# Patient Record
Sex: Male | Born: 1945 | Race: White | Marital: Married | Smoking: Former smoker
Health system: Southern US, Community
[De-identification: ages and names within clinical notes are randomized; demographics above are authoritative.]

---

## 2015-11-16 ENCOUNTER — Other Ambulatory Visit: Payer: Self-pay | Admitting: Orthopaedic Surgery

## 2015-11-16 DIAGNOSIS — M545 Low back pain: Secondary | ICD-10-CM

## 2015-11-21 ENCOUNTER — Ambulatory Visit
Admission: RE | Admit: 2015-11-21 | Discharge: 2015-11-21 | Disposition: A | Discharge status: Home / Self Care | Payer: Medicare Other | Source: Ambulatory Visit | Attending: Orthopaedic Surgery | Admitting: Orthopaedic Surgery

## 2015-11-21 ENCOUNTER — Other Ambulatory Visit: Payer: Self-pay

## 2015-11-21 DIAGNOSIS — M545 Low back pain: Secondary | ICD-10-CM

## 2016-03-11 ENCOUNTER — Telehealth (INDEPENDENT_AMBULATORY_CARE_PROVIDER_SITE_OTHER): Payer: Self-pay | Admitting: Physical Medicine and Rehabilitation

## 2016-03-11 NOTE — Telephone Encounter (Signed)
Just do L3 tf

## 2016-03-12 NOTE — Telephone Encounter (Signed)
No precert required for [employer] 

## 2016-03-12 NOTE — Telephone Encounter (Signed)
Left message for Ivan MalesLinda Landry to call to schedule the patient.

## 2016-03-20 ENCOUNTER — Ambulatory Visit (INDEPENDENT_AMBULATORY_CARE_PROVIDER_SITE_OTHER): Payer: Medicare Other | Admitting: Physical Medicine and Rehabilitation

## 2016-03-20 ENCOUNTER — Encounter (INDEPENDENT_AMBULATORY_CARE_PROVIDER_SITE_OTHER): Payer: Self-pay | Admitting: Physical Medicine and Rehabilitation

## 2016-03-20 VITALS — BP 119/69 | HR 72 | Temp 98.1°F

## 2016-03-20 DIAGNOSIS — M5416 Radiculopathy, lumbar region: Secondary | ICD-10-CM

## 2016-03-20 MED ORDER — LIDOCAINE HCL (PF) 1 % IJ SOLN
0.3300 mL | Freq: Once | INTRAMUSCULAR | Status: AC
  Administered 2016-03-20: 0.3 mL

## 2016-03-20 MED ORDER — METHYLPREDNISOLONE ACETATE 80 MG/ML IJ SUSP
80.0000 mg | Freq: Once | INTRAMUSCULAR | Status: AC
  Administered 2016-03-20: 80 mg

## 2016-03-20 NOTE — Procedures (Signed)
Lumbosacral Transforaminal Epidural Steroid Injection - Infraneural Approach with Fluoroscopic Guidance  Patient: Ivan Landry      Date of Birth: 1945-07-07 MRN: [id number] PCP: No primary care provider on file.      Visit Date: 03/20/2016   Universal Protocol:    Date/Time: 11/22/179:15 AM  Consent Given By: the patient  Position: PRONE   Additional Comments: Vital signs were monitored before and after the procedure. Patient was prepped and draped in the usual sterile fashion. The correct patient, procedure, and site was verified.   Injection Procedure Details:  Procedure Site One Meds Administered:  Meds ordered this encounter  Medications  . lidocaine (PF) (XYLOCAINE) 1 % injection 0.3 mL  . methylPREDNISolone acetate (DEPO-MEDROL) injection 80 mg      Laterality: Left  Location/Site:  L3-L4  Needle size: 22 G  Needle type: Spinal  Needle Placement: Transforaminal  Findings:  -Contrast Used: 1 mL iohexol 180 mg iodine/mL   -Comments: Excellent flow of contrast along the nerve and into the epidural space.  Procedure Details: After squaring off the end-plates of the desired vertebral level to get a true AP view, the C-arm was obliqued to the painful side so that the superior articulating process is positioned about 1/3 the length of the inferior endplate.  The needle was aimed toward the junction of the superior articular process and the transverse process of the inferior vertebrae. The needle's initial entry is in the lower third of the foramen through Kambin's triangle. The soft tissues overlying this target were infiltrated with 2-3 ml. of 1% Lidocaine without Epinephrine.  The spinal needle was then inserted and advanced toward the target using a "trajectory" view along the fluoroscope beam.  Under AP and lateral visualization, the needle was advanced so it did not puncture dura and did not traverse medially beyond the 6 o'clock position of the pedicle. Bi-planar  projections were used to confirm position. Aspiration was confirmed to be negative for CSF and/or blood. A 1-2 ml. volume of Isovue-250 was injected and flow of contrast was noted at each level. Radiographs were obtained for documentation purposes.   After attaining the desired flow of contrast documented above, a 0.5 to 1.0 ml test dose of 0.25% Marcaine was injected into each respective transforaminal space.  The patient was observed for 90 seconds post injection.  After no sensory deficits were reported, and normal lower extremity motor function was noted,   the above injectate was administered so that equal amounts of the injectate were placed at each foramen (level) into the transforaminal epidural space.   Additional Comments:  The patient tolerated the procedure well Dressing: Band-Aid    Post-procedure details: Patient was observed during the procedure. Post-procedure instructions were reviewed.  Patient left the clinic in stable condition.

## 2016-03-20 NOTE — Progress Notes (Signed)
Ivan Landry - 70 y.o. male MRN [id number]  Date of birth: [birth year]  Office Visit Note: Visit Date: 03/20/2016 PCP: No primary care provider on file. Referred by: No ref. provider found  Subjective: Chief Complaint  Patient presents with  . Lower Back - Pain   HPI: Ivan Landry is a pleasant 70 year old gentleman who came to us originally with left hip and leg pain consistent with stenosis. Most of his pain is with standing and ambulating. He says sitting has no pain whatsoever. If he leans forward while he is hurting it does relieve the symptoms. He gets pain in the left hip and leg. Prior injection at L3 and L4 help with the last injection only helped about a week. The one before that was better. He has not had prior back surgery. He has 1 level stenosis.    Lower back pain. Last injection worked about a week. Left leg pain, tingling left leg. Driver present, no blood thinners, no dye allergy. Pain is with standing or walking. Better with sitting or bending over. ROS Otherwise per HPI.  Assessment & Plan: Visit Diagnoses:  1. Lumbar radiculopathy     Plan: Findings:  I'll point to repeat an L3 transforaminal injection try to get good flow of contrast in the area of stenosis. If this seems to help him once again and does last for a good length of time and it may be something we can repeat off and on. The last injection helped greatly but just didn't last very long. I also want him to see Dr. Otelia SergeantNitka in our office for consultation for potential decompression laminectomy.    Meds & Orders:  Meds ordered this encounter  Medications  . lidocaine (PF) (XYLOCAINE) 1 % injection 0.3 mL  . methylPREDNISolone acetate (DEPO-MEDROL) injection 80 mg    Orders Placed This Encounter  Procedures  . Epidural Steroid injection    Follow-up: Return for schedule consult with Dr. Otelia SergeantNitka.   Procedures: No procedures performed  Lumbosacral Transforaminal Epidural Steroid Injection - Infraneural  Approach with Fluoroscopic Guidance  Patient: Ivan Landry      Date of Birth: [birth year] MRN: [id number] PCP: No primary care provider on file.      Visit Date: 03/20/2016   Universal Protocol:    Date/Time: 11/22/179:15 AM  Consent Given By: the patient  Position: PRONE   Additional Comments: Vital signs were monitored before and after the procedure. Patient was prepped and draped in the usual sterile fashion. The correct patient, procedure, and site was verified.   Injection Procedure Details:  Procedure Site One Meds Administered:  Meds ordered this encounter  Medications  . lidocaine (PF) (XYLOCAINE) 1 % injection 0.3 mL  . methylPREDNISolone acetate (DEPO-MEDROL) injection 80 mg      Laterality: Left  Location/Site:  L3-L4  Needle size: 22 G  Needle type: Spinal  Needle Placement: Transforaminal  Findings:  -Contrast Used: 1 mL iohexol 180 mg iodine/mL   -Comments: Excellent flow of contrast along the nerve and into the epidural space.  Procedure Details: After squaring off the end-plates of the desired vertebral level to get a true AP view, the C-arm was obliqued to the painful side so that the superior articulating process is positioned about 1/3 the length of the inferior endplate.  The needle was aimed toward the junction of the superior articular process and the transverse process of the inferior vertebrae. The needle's initial entry is in the lower third of the foramen through Kambin's triangle. The  soft tissues overlying this target were infiltrated with 2-3 ml. of 1% Lidocaine without Epinephrine.  The spinal needle was then inserted and advanced toward the target using a "trajectory" view along the fluoroscope beam.  Under AP and lateral visualization, the needle was advanced so it did not puncture dura and did not traverse medially beyond the 6 o'clock position of the pedicle. Bi-planar projections were used to confirm position. Aspiration was confirmed  to be negative for CSF and/or blood. A 1-2 ml. volume of Isovue-250 was injected and flow of contrast was noted at each level. Radiographs were obtained for documentation purposes.   After attaining the desired flow of contrast documented above, a 0.5 to 1.0 ml test dose of 0.25% Marcaine was injected into each respective transforaminal space.  The patient was observed for 90 seconds post injection.  After no sensory deficits were reported, and normal lower extremity motor function was noted,   the above injectate was administered so that equal amounts of the injectate were placed at each foramen (level) into the transforaminal epidural space.   Additional Comments:  The patient tolerated the procedure well Dressing: Band-Aid    Post-procedure details: Patient was observed during the procedure. Post-procedure instructions were reviewed.  Patient left the clinic in stable condition.     Clinical History: No specialty comments available.  He reports that he quit smoking about 4 years ago. He has never used smokeless tobacco. No results for input(s): HGBA1C, LABURIC in the last 8760 hours.  Objective:  VS:  HT:    WT:   BMI:     BP:119/69  HR:72bpm  TEMP:98.1 F (36.7 C)( )  RESP:97 % Physical Exam  Musculoskeletal:  Patient ambulates with a forward flexed spine. He has good distal strength without deficit.    Ortho Exam Imaging: No results found.  Past Medical/Family/Surgical/Social History: Medications & Allergies reviewed per EMR There are no active problems to display for this patient.  History reviewed. No pertinent past medical history. History reviewed. No pertinent family history. History reviewed. No pertinent surgical history. Social History   Occupational History  . Not on file.   Social History Main Topics  . Smoking status: Former Smoker    Quit date: 2013  . Smokeless tobacco: Never Used  . Alcohol use Not on file  . Drug use: Unknown  . Sexual  activity: Not on file

## 2016-03-20 NOTE — Patient Instructions (Signed)

## 2016-05-03 ENCOUNTER — Ambulatory Visit (INDEPENDENT_AMBULATORY_CARE_PROVIDER_SITE_OTHER): Payer: Medicare HMO | Admitting: Specialist

## 2016-05-03 ENCOUNTER — Encounter (INDEPENDENT_AMBULATORY_CARE_PROVIDER_SITE_OTHER): Payer: Self-pay

## 2016-05-03 DIAGNOSIS — Z5321 Procedure and treatment not carried out due to patient leaving prior to being seen by health care provider: Secondary | ICD-10-CM

## 2016-05-06 ENCOUNTER — Telehealth (INDEPENDENT_AMBULATORY_CARE_PROVIDER_SITE_OTHER): Payer: Self-pay

## 2016-05-06 NOTE — Telephone Encounter (Signed)
Ok if it helped but will need f/up [place] after

## 2016-05-06 NOTE — Telephone Encounter (Signed)
Received voice mail on Friday and pt is requesting another inj. Was supposed to see Dr. Otelia SergeantNitka on Friday but left without being seen. Had left L3 & L4 TF on 01/08/16. Ok to repeat?

## 2016-05-08 NOTE — Progress Notes (Signed)
Patient left without being seen.

## 2016-05-14 ENCOUNTER — Ambulatory Visit (INDEPENDENT_AMBULATORY_CARE_PROVIDER_SITE_OTHER): Payer: Medicare HMO | Admitting: Physical Medicine and Rehabilitation

## 2016-05-14 ENCOUNTER — Encounter (INDEPENDENT_AMBULATORY_CARE_PROVIDER_SITE_OTHER): Payer: Self-pay | Admitting: Physical Medicine and Rehabilitation

## 2016-05-14 VITALS — BP 149/110 | HR 91 | Temp 98.2°F

## 2016-05-14 DIAGNOSIS — M5416 Radiculopathy, lumbar region: Secondary | ICD-10-CM | POA: Diagnosis not present

## 2016-05-14 DIAGNOSIS — M48062 Spinal stenosis, lumbar region with neurogenic claudication: Secondary | ICD-10-CM

## 2016-05-14 MED ORDER — LIDOCAINE HCL (PF) 1 % IJ SOLN
0.3300 mL | Freq: Once | INTRAMUSCULAR | Status: AC
  Administered 2016-05-14: 0.3 mL

## 2016-05-14 MED ORDER — METHYLPREDNISOLONE ACETATE 80 MG/ML IJ SUSP
80.0000 mg | Freq: Once | INTRAMUSCULAR | Status: AC
  Administered 2016-05-14: 80 mg

## 2016-05-14 NOTE — Patient Instructions (Signed)

## 2016-05-14 NOTE — Procedures (Signed)
Lumbosacral Transforaminal Epidural Steroid Injection - Infraneural Approach with Fluoroscopic Guidance  Patient: Ivan Landry      Date of Birth: 1945/12/03 MRN: [id number] PCP: Doreatha MartinVELAZQUEZ,GRETCHEN, MD      Visit Date: 05/14/2016   Universal Protocol:    Date/Time: 01/16/182:14 PM  Consent Given By: the patient  Position: PRONE   Additional Comments: Vital signs were monitored before and after the procedure. Patient was prepped and draped in the usual sterile fashion. The correct patient, procedure, and site was verified.   Injection Procedure Details:  Procedure Site One Meds Administered:  Meds ordered this encounter  Medications  . lidocaine (PF) (XYLOCAINE) 1 % injection 0.3 mL  . methylPREDNISolone acetate (DEPO-MEDROL) injection 80 mg      Laterality: Left  Location/Site:  L3-L4 L4-L5  Needle size: 22 G  Needle type: Spinal  Needle Placement: Transforaminal  Findings:  -Contrast Used: 2 mL iohexol 180 mg iodine/mL   -Comments: Excellent flow of contrast along the nerve and into the epidural space.  Procedure Details: After squaring off the end-plates of the desired vertebral level to get a true AP view, the C-arm was obliqued to the painful side so that the superior articulating process is positioned about 1/3 the length of the inferior endplate.  The needle was aimed toward the junction of the superior articular process and the transverse process of the inferior vertebrae. The needle's initial entry is in the lower third of the foramen through Kambin's triangle. The soft tissues overlying this target were infiltrated with 2-3 ml. of 1% Lidocaine without Epinephrine.  The spinal needle was then inserted and advanced toward the target using a "trajectory" view along the fluoroscope beam.  Under AP and lateral visualization, the needle was advanced so it did not puncture dura and did not traverse medially beyond the 6 o'clock position of the pedicle. Bi-planar  projections were used to confirm position. Aspiration was confirmed to be negative for CSF and/or blood. A 1-2 ml. volume of Isovue-250 was injected and flow of contrast was noted at each level. Radiographs were obtained for documentation purposes.   After attaining the desired flow of contrast documented above, a 0.5 to 1.0 ml test dose of 0.25% Marcaine was injected into each respective transforaminal space.  The patient was observed for 90 seconds post injection.  After no sensory deficits were reported, and normal lower extremity motor function was noted,   the above injectate was administered so that equal amounts of the injectate were placed at each foramen (level) into the transforaminal epidural space.   Additional Comments:  The patient tolerated the procedure well No complications occurred Dressing: Band-Aid and 2x2 sterile gauze     Post-procedure details: Patient was observed during the procedure. Post-procedure instructions were reviewed.  Patient left the clinic in stable condition.

## 2016-05-14 NOTE — Progress Notes (Signed)
Ivan CollinDennis Landry - 71 y.o. male MRN [id number]  Date of birth: [birth year]  Office Visit Note: Visit Date: 05/14/2016 PCP: Doreatha MartinVELAZQUEZ,GRETCHEN, MD Referred by: Cheron SchaumannVelazquez, Gretchen Y.,*  Subjective: Chief Complaint  Patient presents with  . Lower Back - Pain   HPI: Ivan Landry is a pleasant and active 71 year old gentleman with known severe stenosis at L3-4 and L4-5. Patient states he had great relief with last injection for around 3 weeks and then gradual increase in pain. Pain all the way across lower back and down left leg with walking. Starts with tingling and then enough pain if he keeps walking he has to sit down for a few minutes to get relief. Also Gets relief with bending over. He was scheduled to see Dr. Otelia SergeantNitka for evaluation for potential decompression surgery. He states that the person that brought him to the appointment at another appointment to go to Dr. Otelia SergeantNitka was running late so he did have to postpone that evaluation but he does want to see him. The patient has failed conservative care with medications and time in therapy and is had injections in the very beneficial just short lived and not long-lasting.    ROS Otherwise per HPI.  Assessment & Plan: Visit Diagnoses:  1. Lumbar radiculopathy   2. Spinal stenosis of lumbar region with neurogenic claudication     Plan: Findings:  Repeat left L3 and L4 transforaminal epidural steroid injection for left radicular leg pain with stenosis at both levels which is severe. Patient did well with last injection.    Meds & Orders:  Meds ordered this encounter  Medications  . lidocaine (PF) (XYLOCAINE) 1 % injection 0.3 mL  . methylPREDNISolone acetate (DEPO-MEDROL) injection 80 mg    Orders Placed This Encounter  Procedures  . Epidural Steroid injection    Follow-up: Return if symptoms worsen or fail to improve, 2 weeks.   Procedures: No procedures performed  Lumbosacral Transforaminal Epidural Steroid Injection - Infraneural  Approach with Fluoroscopic Guidance  Patient: Ivan Landry      Date of Birth: [birth year] MRN: [id number] PCP: Doreatha MartinVELAZQUEZ,GRETCHEN, MD      Visit Date: 05/14/2016   Universal Protocol:    Date/Time: 01/16/182:14 PM  Consent Given By: the patient  Position: PRONE   Additional Comments: Vital signs were monitored before and after the procedure. Patient was prepped and draped in the usual sterile fashion. The correct patient, procedure, and site was verified.   Injection Procedure Details:  Procedure Site One Meds Administered:  Meds ordered this encounter  Medications  . lidocaine (PF) (XYLOCAINE) 1 % injection 0.3 mL  . methylPREDNISolone acetate (DEPO-MEDROL) injection 80 mg      Laterality: Left  Location/Site:  L3-L4 L4-L5  Needle size: 22 G  Needle type: Spinal  Needle Placement: Transforaminal  Findings:  -Contrast Used: 2 mL iohexol 180 mg iodine/mL   -Comments: Excellent flow of contrast along the nerve and into the epidural space.  Procedure Details: After squaring off the end-plates of the desired vertebral level to get a true AP view, the C-arm was obliqued to the painful side so that the superior articulating process is positioned about 1/3 the length of the inferior endplate.  The needle was aimed toward the junction of the superior articular process and the transverse process of the inferior vertebrae. The needle's initial entry is in the lower third of the foramen through Kambin's triangle. The soft tissues overlying this target were infiltrated with 2-3 ml. of 1% Lidocaine without Epinephrine.  The spinal needle was then inserted and advanced toward the target using a "trajectory" view along the fluoroscope beam.  Under AP and lateral visualization, the needle was advanced so it did not puncture dura and did not traverse medially beyond the 6 o'clock position of the pedicle. Bi-planar projections were used to confirm position. Aspiration was confirmed to  be negative for CSF and/or blood. A 1-2 ml. volume of Isovue-250 was injected and flow of contrast was noted at each level. Radiographs were obtained for documentation purposes.   After attaining the desired flow of contrast documented above, a 0.5 to 1.0 ml test dose of 0.25% Marcaine was injected into each respective transforaminal space.  The patient was observed for 90 seconds post injection.  After no sensory deficits were reported, and normal lower extremity motor function was noted,   the above injectate was administered so that equal amounts of the injectate were placed at each foramen (level) into the transforaminal epidural space.   Additional Comments:  The patient tolerated the procedure well No complications occurred Dressing: Band-Aid and 2x2 sterile gauze     Post-procedure details: Patient was observed during the procedure. Post-procedure instructions were reviewed.  Patient left the clinic in stable condition.     Clinical History: No specialty comments available.  He reports that he quit smoking about 5 years ago. He has never used smokeless tobacco. No results for input(s): HGBA1C, LABURIC in the last 8760 hours.  Objective:  VS:  HT:    WT:   BMI:     BP:(!) 149/110  HR:91bpm  TEMP:98.2 F (36.8 C)( )  RESP:95 % Physical Exam  Musculoskeletal:  The patient ambulates without aid with a forward flexed spine with good distal strength.    Ortho Exam Imaging: No results found.  Past Medical/Family/Surgical/Social History: Medications & Allergies reviewed per EMR There are no active problems to display for this patient.  No past medical history on file. No family history on file. No past surgical history on file. Social History   Occupational History  . Not on file.   Social History Main Topics  . Smoking status: Former Smoker    Quit date: 2013  . Smokeless tobacco: Never Used  . Alcohol use Not on file  . Drug use: Unknown  . Sexual activity: Not  on file

## 2016-06-26 ENCOUNTER — Ambulatory Visit (INDEPENDENT_AMBULATORY_CARE_PROVIDER_SITE_OTHER): Payer: Medicare HMO | Admitting: Specialist

## 2016-06-26 ENCOUNTER — Ambulatory Visit (INDEPENDENT_AMBULATORY_CARE_PROVIDER_SITE_OTHER): Payer: Medicare HMO

## 2016-06-26 ENCOUNTER — Encounter (INDEPENDENT_AMBULATORY_CARE_PROVIDER_SITE_OTHER): Payer: Self-pay | Admitting: Specialist

## 2016-06-26 VITALS — BP 117/71 | HR 67 | Ht 68.5 in | Wt 177.0 lb

## 2016-06-26 DIAGNOSIS — M48062 Spinal stenosis, lumbar region with neurogenic claudication: Secondary | ICD-10-CM | POA: Diagnosis not present

## 2016-06-26 DIAGNOSIS — M4316 Spondylolisthesis, lumbar region: Secondary | ICD-10-CM

## 2016-06-26 NOTE — Patient Instructions (Signed)
Avoid bending, stooping and avoid lifting weights greater than 10 lbs. Avoid prolong standing and walking. Avoid frequent bending and stooping  No lifting greater than 10 lbs. May use ice or moist heat for pain. Weight loss is of benefit. Handicap license is approved. Dr. Newton's secretary/Assistant will call to arrange for epidural steroid injection  

## 2016-06-26 NOTE — Progress Notes (Signed)
Office Visit Note   Patient: Ivan Landry           Date of Birth: Sep 17, 1945           MRN: [id number] Visit Date: 06/26/2016              Requested by: Cheron Schaumann, MD [address] [place], [place] [address] PCP: Doreatha Martin, MD   Assessment & Plan: Visit Diagnoses:  1. Spinal stenosis of lumbar region with neurogenic claudication   2. Spondylolisthesis, lumbar region     Plan: Avoid bending, stooping and avoid lifting weights greater than 10 lbs. Avoid prolong standing and walking. Avoid frequent bending and stooping  No lifting greater than 10 lbs. May use ice or moist heat for pain. Weight loss is of benefit. Handicap license is approved. Dr. Lamont Blas secretary/Assistant will call to arrange for epidural steroid injection    Follow-Up Instructions: No Follow-up on file.   Orders:  Orders Placed This Encounter  Procedures  . XR Lumbar Spine 2-3 Views  . Ambulatory referral to Physical Medicine Rehab   No orders of the defined types were placed in this encounter.     Procedures: No procedures performed   Clinical Data: No additional findings.   Subjective: Chief Complaint  Patient presents with  . Lower Back - Pain    Patient returns for follow up low back pain. He is status post Left L3-4, L4-5 transforaminal injection with Dr. Alvester Morin on 05/14/2016. He states that this injection has helped better than any previous one. The pain that he had on the left side is completely gone. He does have some pain now on the right side of his back that radiates to the lateral side of the right hip. He states that this is new. Pain the last 2 nights and over the last 2 weeks with pain in the right side upper buttock and right lateral hip and lateral proximal thigh. Cough or sneeze with pain in both buttocks. Bowel and bladder function is normal. Walking distance is limited sometimes couldn't walk far without leaning on a cart or finding a place to sit.  With the injection the pain with standing and walking improved.    Review of Systems  Constitutional: Negative.   HENT: Negative.   Eyes: Negative.   Respiratory: Negative.   Cardiovascular: Negative.   Gastrointestinal: Negative.   Endocrine: Negative.   Genitourinary: Negative.   Musculoskeletal: Negative.   Skin: Negative.   Allergic/Immunologic: Negative.   Neurological: Negative.   Hematological: Negative.   Psychiatric/Behavioral: Negative.      Objective: Vital Signs: BP 117/71   Pulse 67   Ht 5' 8.5" (1.74 m)   Wt 177 lb (80.3 kg)   BMI 26.52 kg/m   Physical Exam  Constitutional: He is oriented to person, place, and time. He appears well-developed and well-nourished.  HENT:  Head: Normocephalic and atraumatic.  Eyes: EOM are normal. Pupils are equal, round, and reactive to light.  Neck: Normal range of motion. Neck supple.  Pulmonary/Chest: Effort normal and breath sounds normal.  Abdominal: Soft. Bowel sounds are normal.  Neurological: He is alert and oriented to person, place, and time.  Skin: Skin is warm and dry.  Psychiatric: He has a normal mood and affect. His behavior is normal. Judgment and thought content normal.    Back Exam   Tenderness  The patient is experiencing tenderness in the lumbar.  Range of Motion  Extension: abnormal  Flexion: abnormal  Lateral Bend  Right: abnormal  Lateral Bend Left: abnormal  Rotation Right: abnormal  Rotation Left: abnormal   Muscle Strength  Right Quadriceps:  5/5  Left Quadriceps:  5/5  Right Hamstrings:  5/5  Left Hamstrings:  5/5   Reflexes  Babinski's sign: normal   Other  Toe Walk: normal Heel Walk: normal Sensation: decreased Gait: abnormal   Comments:  Weaknes in both feet dorsiflexors      Specialty Comments:  No specialty comments available.  Imaging: Xr Lumbar Spine 2-3 Views  Result Date: 06/26/2016 AP and lateral flexion and extension radiographs show grade 1  spondylolisthesis L3-4 no change with flexion and extension, minimal anterolisthesis at L4-5 1-2 mm with reduction in extension. 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 No fracture, or dislocation    PMFS History: There are no active problems to display for this patient.  No past medical history on file.  No family history on file.  No past surgical history on file. Social History   Occupational History  . Not on file.   Social History Main Topics  . Smoking status: Former Smoker    Quit date: 2013  . Smokeless tobacco: Never Used  . Alcohol use Not on file  . Drug use: Unknown  . Sexual activity: Not on file

## 2016-06-27 ENCOUNTER — Ambulatory Visit (INDEPENDENT_AMBULATORY_CARE_PROVIDER_SITE_OTHER): Payer: Medicare HMO | Admitting: Specialist

## 2016-07-09 ENCOUNTER — Ambulatory Visit (INDEPENDENT_AMBULATORY_CARE_PROVIDER_SITE_OTHER): Payer: Medicare HMO | Admitting: Physical Medicine and Rehabilitation

## 2016-07-09 ENCOUNTER — Ambulatory Visit (INDEPENDENT_AMBULATORY_CARE_PROVIDER_SITE_OTHER): Payer: Self-pay

## 2016-07-09 ENCOUNTER — Encounter (INDEPENDENT_AMBULATORY_CARE_PROVIDER_SITE_OTHER): Payer: Self-pay | Admitting: Physical Medicine and Rehabilitation

## 2016-07-09 VITALS — BP 137/82 | HR 66

## 2016-07-09 DIAGNOSIS — M5416 Radiculopathy, lumbar region: Secondary | ICD-10-CM | POA: Diagnosis not present

## 2016-07-09 MED ORDER — LIDOCAINE HCL (PF) 1 % IJ SOLN
0.3300 mL | Freq: Once | INTRAMUSCULAR | Status: AC
  Administered 2016-07-09: 0.3 mL

## 2016-07-09 MED ORDER — METHYLPREDNISOLONE ACETATE 80 MG/ML IJ SUSP
80.0000 mg | Freq: Once | INTRAMUSCULAR | Status: AC
  Administered 2016-07-09: 80 mg

## 2016-07-09 NOTE — Procedures (Signed)
Lumbosacral Transforaminal Epidural Steroid Injection - Infraneural Approach with Fluoroscopic Guidance  Patient: Ivan Landry      Date of Birth: 14-Mar-1946 MRN: [id number] PCP: Doreatha MartinVELAZQUEZ,GRETCHEN, MD      Visit Date: 07/09/2016   Universal Protocol:    Date/Time: 03/13/182:55 PM  Consent Given By: the patient  Position: PRONE   Additional Comments: Vital signs were monitored before and after the procedure. Patient was prepped and draped in the usual sterile fashion. The correct patient, procedure, and site was verified.   Injection Procedure Details:  Procedure Site One Meds Administered:  Meds ordered this encounter  Medications  . lidocaine (PF) (XYLOCAINE) 1 % injection 0.3 mL  . methylPREDNISolone acetate (DEPO-MEDROL) injection 80 mg      Laterality: Right  Location/Site:  L4-L5 L5-S1  Needle size: 22 G  Needle type: Spinal  Needle Placement: Transforaminal  Findings:  -Contrast Used: 1 mL iohexol 180 mg iodine/mL   -Comments: Excellent flow of contrast along the nerve and into the epidural space.  Procedure Details: After squaring off the end-plates of the desired vertebral level to get a true AP view, the C-arm was obliqued to the painful side so that the superior articulating process is positioned about 1/3 the length of the inferior endplate.  The needle was aimed toward the junction of the superior articular process and the transverse process of the inferior vertebrae. The needle's initial entry is in the lower third of the foramen through Kambin's triangle. The soft tissues overlying this target were infiltrated with 2-3 ml. of 1% Lidocaine without Epinephrine.  The spinal needle was then inserted and advanced toward the target using a "trajectory" view along the fluoroscope beam.  Under AP and lateral visualization, the needle was advanced so it did not puncture dura and did not traverse medially beyond the 6 o'clock position of the pedicle. Bi-planar  projections were used to confirm position. Aspiration was confirmed to be negative for CSF and/or blood. A 1-2 ml. volume of Isovue-250 was injected and flow of contrast was noted at each level. Radiographs were obtained for documentation purposes.   After attaining the desired flow of contrast documented above, a 0.5 to 1.0 ml test dose of 0.25% Marcaine was injected into each respective transforaminal space.  The patient was observed for 90 seconds post injection.  After no sensory deficits were reported, and normal lower extremity motor function was noted,   the above injectate was administered so that equal amounts of the injectate were placed at each foramen (level) into the transforaminal epidural space.   Additional Comments:  The patient tolerated the procedure well Dressing: Band-Aid    Post-procedure details: Patient was observed during the procedure. Post-procedure instructions were reviewed.  Patient left the clinic in stable condition.

## 2016-07-09 NOTE — Patient Instructions (Signed)

## 2016-07-09 NOTE — Progress Notes (Signed)
Ivan Landry - 71 y.o. male MRN [id number]  Date of birth: Mar 13, 1946  Office Visit Note: Visit Date: 07/09/2016 PCP: Doreatha MartinVELAZQUEZ,GRETCHEN, MD Referred by: Cheron SchaumannVelazquez, Gretchen Y.,*  Subjective: Chief Complaint  Patient presents with  . 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 The symptoms axial resolved fairly nicely after the injection. He states he still has a little bit symptoms on the left but not that bad at all. His right side is been given a more complaints lately particularly with standing and twisting and he will get a radiating pattern in the hip is very sharp. He has actually seen Dr. Otelia SergeantNitka for evaluation of Dr. Otelia SergeantNitka requested L4-L5 transforaminal injection on the right. The patient's case is complicated by history of anxiety Pain across lower back and now worse on right side for around 1 month. Left side still bothers him but not as bad since last injection. Sharp pains with certain movements and when he coughs. Denies numbness or tingling.    right L4 and L5 ROS Otherwise per HPI.  Assessment & Plan: Visit Diagnoses:  1. Lumbar radiculopathy     Plan: Findings:  Right L4-L5 transforaminal epidural steroid injection. As of note the L5 transforaminal injection elicited concordant symptoms with injection of medication.    Meds & Orders:  Meds ordered this encounter  Medications  . lidocaine (PF) (XYLOCAINE) 1 % injection 0.3 mL  . methylPREDNISolone acetate (DEPO-MEDROL) injection 80 mg    Orders Placed This Encounter  Procedures  . XR C-ARM NO REPORT  . Epidural Steroid injection    Follow-up: Return for scheduled follow up with Dr. Otelia SergeantNitka.   Procedures: No procedures performed  Lumbosacral Transforaminal Epidural Steroid Injection - Infraneural Approach with Fluoroscopic  Guidance  Patient: Ivan Landry      Date of Birth: Mar 13, 1946 MRN: [id number] PCP: Doreatha MartinVELAZQUEZ,GRETCHEN, MD      Visit Date: 07/09/2016   Universal Protocol:    Date/Time: 03/13/182:55 PM  Consent Given By: the patient  Position: PRONE   Additional Comments: Vital signs were monitored before and after the procedure. Patient was prepped and draped in the usual sterile fashion. The correct patient, procedure, and site was verified.   Injection Procedure Details:  Procedure Site One Meds Administered:  Meds ordered this encounter  Medications  . lidocaine (PF) (XYLOCAINE) 1 % injection 0.3 mL  . methylPREDNISolone acetate (DEPO-MEDROL) injection 80 mg      Laterality: Right  Location/Site:  L4-L5 L5-S1  Needle size: 22 G  Needle type: Spinal  Needle Placement: Transforaminal  Findings:  -Contrast Used: 1 mL iohexol 180 mg iodine/mL   -Comments: Excellent flow of contrast along the nerve and into the epidural space.  Procedure Details: After squaring off the end-plates of the desired vertebral level to get a true AP view, the C-arm was obliqued to the painful side so that the superior articulating process is positioned about 1/3 the length of the inferior endplate.  The needle was aimed toward the junction of the superior articular process and the transverse process of the inferior vertebrae. The needle's initial entry is in the lower third of the foramen through Kambin's triangle. The soft tissues overlying this target were infiltrated with 2-3 ml. of 1% Lidocaine without Epinephrine.  The spinal needle was then inserted and advanced toward the target  using a "trajectory" view along the fluoroscope beam.  Under AP and lateral visualization, the needle was advanced so it did not puncture dura and did not traverse medially beyond the 6 o'clock position of the pedicle. Bi-planar projections were used to confirm position. Aspiration was confirmed to be negative for CSF and/or  blood. A 1-2 ml. volume of Isovue-250 was injected and flow of contrast was noted at each level. Radiographs were obtained for documentation purposes.   After attaining the desired flow of contrast documented above, a 0.5 to 1.0 ml test dose of 0.25% Marcaine was injected into each respective transforaminal space.  The patient was observed for 90 seconds post injection.  After no sensory deficits were reported, and normal lower extremity motor function was noted,   the above injectate was administered so that equal amounts of the injectate were placed at each foramen (level) into the transforaminal epidural space.   Additional Comments:  The patient tolerated the procedure well Dressing: Band-Aid    Post-procedure details: Patient was observed during the procedure. Post-procedure instructions were reviewed.  Patient left the clinic in stable condition.   Clinical History: Lumbar spine MRI dated 11/21/2015  L4-5: Diffuse bulging degenerated annulus, short pedicles, advanced facet disease and significant ligamentum flavum thickening all contributing to severe spinal and bilateral lateral recess stenosis. Mild foraminal encroachment bilaterally also. L5-S1: Mild annular bulge and moderate facet disease but no focal disc protrusions, spinal or foraminal stenosis. Fat IMPRESSION: 1. 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 2. Broad-based left foraminal and extra foraminal disc protrusion at L3-4 contacting and displacing the left L3 nerve root. This may account for the patient's left thigh pain. 3. Broad-based bulging annulus at L1-2 with mild bilateral lateral recess and foraminal encroachment.  He reports that he quit smoking about 5 years ago. He has never used smokeless tobacco. No results for input(s): HGBA1C, LABURIC in the last 8760 hours.  Objective:  VS:  HT:    WT:   BMI:     BP:137/82  HR:66bpm  TEMP: ( )  RESP:99 % Physical  Exam  Musculoskeletal:  Patient ambulates with a forward flexed spine but without aid in good distal strength.    Ortho Exam Imaging: Xr C-arm No Report  Result Date: 07/09/2016 Please see Notes or Procedures tab for imaging impression.   Past Medical/Family/Surgical/Social History: Medications & Allergies reviewed per EMR There are no active problems to display for this patient.  History reviewed. No pertinent past medical history. History reviewed. No pertinent family history. History reviewed. No pertinent surgical history. Social History   Occupational History  . Not on file.   Social History Main Topics  . Smoking status: Former Smoker    Quit date: 2013  . Smokeless tobacco: Never Used  . Alcohol use Not on file  . Drug use: Unknown  . Sexual activity: Not on file

## 2016-07-17 ENCOUNTER — Ambulatory Visit (INDEPENDENT_AMBULATORY_CARE_PROVIDER_SITE_OTHER): Payer: Medicare HMO | Admitting: Specialist

## 2016-08-08 ENCOUNTER — Telehealth (INDEPENDENT_AMBULATORY_CARE_PROVIDER_SITE_OTHER): Payer: Self-pay

## 2016-08-08 ENCOUNTER — Encounter (INDEPENDENT_AMBULATORY_CARE_PROVIDER_SITE_OTHER): Payer: Medicare HMO | Admitting: Physical Medicine and Rehabilitation

## 2016-08-08 NOTE — Telephone Encounter (Signed)
Dr Alvester Morin did pre peer to peer review and they cant approve injection at this time. Sending to dr to review. Pt is aware we will have to cancel his appt today and call him back to schedule once auth is obtained.

## 2016-08-08 NOTE — Telephone Encounter (Signed)
-----   Message from Cruzita Lederer, RMA sent at 07/09/2016  3:40 PM EDT ----- Regarding: [employer] auth Scheduled for rpt right L4 & L5 tf on 08/08/16 and will need prior auth

## 2016-08-21 ENCOUNTER — Ambulatory Visit (INDEPENDENT_AMBULATORY_CARE_PROVIDER_SITE_OTHER): Payer: Medicare HMO | Admitting: Physical Medicine and Rehabilitation

## 2016-08-21 ENCOUNTER — Ambulatory Visit (INDEPENDENT_AMBULATORY_CARE_PROVIDER_SITE_OTHER): Payer: Medicare HMO

## 2016-08-21 ENCOUNTER — Encounter (INDEPENDENT_AMBULATORY_CARE_PROVIDER_SITE_OTHER): Payer: Self-pay | Admitting: Physical Medicine and Rehabilitation

## 2016-08-21 VITALS — BP 126/77 | HR 100

## 2016-08-21 DIAGNOSIS — M48062 Spinal stenosis, lumbar region with neurogenic claudication: Secondary | ICD-10-CM | POA: Diagnosis not present

## 2016-08-21 DIAGNOSIS — M5416 Radiculopathy, lumbar region: Secondary | ICD-10-CM | POA: Diagnosis not present

## 2016-08-21 MED ORDER — METHYLPREDNISOLONE ACETATE 80 MG/ML IJ SUSP
80.0000 mg | Freq: Once | INTRAMUSCULAR | Status: AC
  Administered 2016-08-21: 80 mg

## 2016-08-21 MED ORDER — IIOPAMIDOL (ISOVUE-250) INJECTION 51%
3.0000 mL | Freq: Once | INTRAVENOUS | Status: AC
  Administered 2016-08-21: 3 mL
  Filled 2016-08-21: qty 50

## 2016-08-21 MED ORDER — LIDOCAINE HCL (PF) 1 % IJ SOLN
2.0000 mL | Freq: Once | INTRAMUSCULAR | Status: AC
  Administered 2016-08-21: 2 mL

## 2016-08-21 NOTE — Progress Notes (Deleted)
States pain moves around. Left side is typically worse. Radiates down leg leg to knee. Does not radiate down right leg. Pain with walking. Relief with sitting.

## 2016-08-21 NOTE — Procedures (Signed)
Lumbosacral Transforaminal Epidural Steroid Injection - Infraneural Approach with Fluoroscopic Guidance  Patient: Ivan Landry      Date of Birth: 1945/12/22 MRN: [id number] PCP: Doreatha Martin, MD      Visit Date: 08/21/2016   Universal Protocol:    Date/Time: 04/25/181:32 PM  Consent Given By: the patient  Position: PRONE   Additional Comments: Vital signs were monitored before and after the procedure. Patient was prepped and draped in the usual sterile fashion. The correct patient, procedure, and site was verified.   Injection Procedure Details:  Procedure Site One Meds Administered:  Meds ordered this encounter  Medications  . lidocaine (PF) (XYLOCAINE) 1 % injection 2 mL  . iopamidol (ISOVUE-250) 51 % injection 3 mL  . methylPREDNISolone acetate (DEPO-MEDROL) injection 80 mg      Laterality: Right  Location/Site:  L4-L5 L5-S1  Needle size: 22 G  Needle type: Spinal  Needle Placement: Transforaminal  Findings:  -Contrast Used: 1 mL iohexol 180 mg iodine/mL   -Comments: Excellent flow of contrast along the nerve and into the epidural space.  Procedure Details: After squaring off the end-plates of the desired vertebral level to get a true AP view, the C-arm was obliqued to the painful side so that the superior articulating process is positioned about 1/3 the length of the inferior endplate.  The needle was aimed toward the junction of the superior articular process and the transverse process of the inferior vertebrae. The needle's initial entry is in the lower third of the foramen through Kambin's triangle. The soft tissues overlying this target were infiltrated with 2-3 ml. of 1% Lidocaine without Epinephrine.  The spinal needle was then inserted and advanced toward the target using a "trajectory" view along the fluoroscope beam.  Under AP and lateral visualization, the needle was advanced so it did not puncture dura and did not traverse medially beyond the 6  o'clock position of the pedicle. Bi-planar projections were used to confirm position. Aspiration was confirmed to be negative for CSF and/or blood. A 1-2 ml. volume of Isovue-250 was injected and flow of contrast was noted at each level. Radiographs were obtained for documentation purposes.   After attaining the desired flow of contrast documented above, a 0.5 to 1.0 ml test dose of 0.25% Marcaine was injected into each respective transforaminal space.  The patient was observed for 90 seconds post injection.  After no sensory deficits were reported, and normal lower extremity motor function was noted,   the above injectate was administered so that equal amounts of the injectate were placed at each foramen (level) into the transforaminal epidural space.   Additional Comments:  The patient tolerated the procedure well Dressing: Band-Aid    Post-procedure details: Patient was observed during the procedure. Post-procedure instructions were reviewed.  Patient left the clinic in stable condition.

## 2016-08-21 NOTE — Patient Instructions (Signed)

## 2016-08-22 NOTE — Progress Notes (Signed)
Ivan Landry - 71 y.o. male MRN [id number]  Date of birth: [birth year]  Office Visit Note: Visit Date: 08/21/2016 PCP: Doreatha Martin, MD Referred by: Cheron Schaumann.,*  Subjective: Chief Complaint  Patient presents with  . Lower Back - Pain   HPI: Ivan Landry is a 71 year old gentleman accompanied by his wife who provided some of the history. He is well-known to [place] with history of severe multifactorial stenosis at L3-4 and L4-5. He gets more L5 type symptoms bilaterally. Unfortunately his symptoms seem to switch sides frequently. He still very active individual doing a lot of yard work and still working at doing that as well. Previous injections on the left side that we completed on 2 occasions gave him great relief of his left hip and leg pain and allow him to be more functional. The last time I saw we completed a right-sided injection with good relief but short lived. That was in March. He called back and with more right-sided complaints we wanted to complete one more injection. He has been followed by Dr. Otelia Sergeant the spine surgeon in our office. They are looking at possible decompression surgery soon. We appealed to his insurance company and did get approval for repeat right-sided L4 L5 transforaminal epidural steroid injection.    ROS Otherwise per HPI.  Assessment & Plan: Visit Diagnoses:  1. Lumbar radiculopathy   2. Spinal stenosis of lumbar region with neurogenic claudication     Plan: Findings:  Right L4 L5 transforaminal epidural steroid injection.    Meds & Orders:  Meds ordered this encounter  Medications  . lidocaine (PF) (XYLOCAINE) 1 % injection 2 mL  . iopamidol (ISOVUE-250) 51 % injection 3 mL  . methylPREDNISolone acetate (DEPO-MEDROL) injection 80 mg    Orders Placed This Encounter  Procedures  . XR C-ARM NO REPORT  . Epidural Steroid injection    Follow-up: Return if symptoms worsen or fail to improve, for Dr. Otelia Sergeant.   Procedures: No procedures  performed  Lumbosacral Transforaminal Epidural Steroid Injection - Infraneural Approach with Fluoroscopic Guidance  Patient: Ivan Landry      Date of Birth: 1945/06/29 MRN: [id number] PCP: Doreatha Martin, MD      Visit Date: 08/21/2016   Universal Protocol:    Date/Time: 04/25/181:32 PM  Consent Given By: the patient  Position: PRONE   Additional Comments: Vital signs were monitored before and after the procedure. Patient was prepped and draped in the usual sterile fashion. The correct patient, procedure, and site was verified.   Injection Procedure Details:  Procedure Site One Meds Administered:  Meds ordered this encounter  Medications  . lidocaine (PF) (XYLOCAINE) 1 % injection 2 mL  . iopamidol (ISOVUE-250) 51 % injection 3 mL  . methylPREDNISolone acetate (DEPO-MEDROL) injection 80 mg      Laterality: Right  Location/Site:  L4-L5 L5-S1  Needle size: 22 G  Needle type: Spinal  Needle Placement: Transforaminal  Findings:  -Contrast Used: 1 mL iohexol 180 mg iodine/mL   -Comments: Excellent flow of contrast along the nerve and into the epidural space.  Procedure Details: After squaring off the end-plates of the desired vertebral level to get a true AP view, the C-arm was obliqued to the painful side so that the superior articulating process is positioned about 1/3 the length of the inferior endplate.  The needle was aimed toward the junction of the superior articular process and the transverse process of the inferior vertebrae. The needle's initial entry is in the lower third of the foramen through  Kambin's triangle. The soft tissues overlying this target were infiltrated with 2-3 ml. of 1% Lidocaine without Epinephrine.  The spinal needle was then inserted and advanced toward the target using a "trajectory" view along the fluoroscope beam.  Under AP and lateral visualization, the needle was advanced so it did not puncture dura and did not traverse medially  beyond the 6 o'clock position of the pedicle. Bi-planar projections were used to confirm position. Aspiration was confirmed to be negative for CSF and/or blood. A 1-2 ml. volume of Isovue-250 was injected and flow of contrast was noted at each level. Radiographs were obtained for documentation purposes.   After attaining the desired flow of contrast documented above, a 0.5 to 1.0 ml test dose of 0.25% Marcaine was injected into each respective transforaminal space.  The patient was observed for 90 seconds post injection.  After no sensory deficits were reported, and normal lower extremity motor function was noted,   the above injectate was administered so that equal amounts of the injectate were placed at each foramen (level) into the transforaminal epidural space.   Additional Comments:  The patient tolerated the procedure well Dressing: Band-Aid    Post-procedure details: Patient was observed during the procedure. Post-procedure instructions were reviewed.  Patient left the clinic in stable condition.   Clinical History: Lumbar spine MRI dated 11/21/2015  L4-5: Diffuse bulging degenerated annulus, short pedicles, advanced facet disease and significant ligamentum flavum thickening all contributing to severe spinal and bilateral lateral recess stenosis. Mild foraminal encroachment bilaterally also. L5-S1: Mild annular bulge and moderate facet disease but no focal disc protrusions, spinal or foraminal stenosis. Fat IMPRESSION: 1. 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 2. Broad-based left foraminal and extra foraminal disc protrusion at L3-4 contacting and displacing the left L3 nerve root. This may account for the patient's left thigh pain. 3. Broad-based bulging annulus at L1-2 with mild bilateral lateral recess and foraminal encroachment.  He reports that he quit smoking about 5 years ago. He has never used smokeless tobacco. No results  for input(s): HGBA1C, LABURIC in the last 8760 hours.  Objective:  VS:  HT:    WT:   BMI:     BP:126/77  HR:100bpm  TEMP: ( )  RESP:99 % Physical Exam  Musculoskeletal:  Patient ambulates with an antalgic gait with a forward flexed spine. He has good distal strength without clonus.    Ortho Exam Imaging: Xr C-arm No Report  Result Date: 08/21/2016 Please see Notes or Procedures tab for imaging impression.   Past Medical/Family/Surgical/Social History: Medications & Allergies reviewed per EMR There are no active problems to display for this patient.  No past medical history on file. No family history on file. No past surgical history on file. Social History   Occupational History  . Not on file.   Social History Main Topics  . Smoking status: Former Smoker    Quit date: 2013  . Smokeless tobacco: Never Used  . Alcohol use Not on file  . Drug use: Unknown  . Sexual activity: Not on file

## 2016-09-12 ENCOUNTER — Other Ambulatory Visit (INDEPENDENT_AMBULATORY_CARE_PROVIDER_SITE_OTHER): Payer: Self-pay | Admitting: Orthopaedic Surgery

## 2016-09-12 NOTE — Telephone Encounter (Signed)
Please advise 

## 2016-09-25 ENCOUNTER — Ambulatory Visit (INDEPENDENT_AMBULATORY_CARE_PROVIDER_SITE_OTHER): Payer: Medicare HMO | Admitting: Specialist

## 2017-10-16 ENCOUNTER — Other Ambulatory Visit (INDEPENDENT_AMBULATORY_CARE_PROVIDER_SITE_OTHER): Payer: Self-pay

## 2017-10-16 ENCOUNTER — Encounter (INDEPENDENT_AMBULATORY_CARE_PROVIDER_SITE_OTHER): Payer: Self-pay | Admitting: Orthopaedic Surgery

## 2017-10-16 ENCOUNTER — Ambulatory Visit (INDEPENDENT_AMBULATORY_CARE_PROVIDER_SITE_OTHER): Payer: Medicare HMO | Admitting: Orthopaedic Surgery

## 2017-10-16 DIAGNOSIS — G5601 Carpal tunnel syndrome, right upper limb: Secondary | ICD-10-CM | POA: Diagnosis not present

## 2017-10-16 DIAGNOSIS — M79641 Pain in right hand: Secondary | ICD-10-CM

## 2017-10-16 NOTE — Progress Notes (Signed)
   Office Visit Note   Patient: Jamal CollinDennis Hounshell           Date of Birth: 07/15/1945           MRN: [id number] Visit Date: 10/16/2017              Requested by: Cheron SchaumannVelazquez, Gretchen Y., MD [address] [place], [place] [id number] PCP: Cheron SchaumannVelazquez, Gretchen Y., MD   Assessment & Plan: Visit Diagnoses:  1. Carpal tunnel syndrome, right upper limb     Plan: His clinical exam as well as signs and symptoms seem to be consistent with carpal tunnel syndrome of the right upper extremity.  I would like to obtain nerve conduction velocity studies/EMGs by Dr. Alvester MorinNewton to confirm the diagnosis and the potential severity so the appropriate recommendations can be made for treatment.  The patient is agreeable to this plan.  All questions concerns were answered and addressed.  Follow-Up Instructions: He will call to get a return follow-up appointment with us once he has the nerve conduction studies performed on his right upper extremity.  Orders:  No orders of the defined types were placed in this encounter.  No orders of the defined types were placed in this encounter.     Procedures: No procedures performed   Clinical Data: No additional findings.   Subjective: Chief Complaint  Patient presents with  . Right Hand - Numbness  Patient is a very pleasant right-hand-dominant 72 year old who is been having worsening right dominant hand numbness and tingling and pain he points to the thumb, index, and middle fingers as a source of numbness and tingling as well as pain and weakness.  This is been slowly getting worse with time.  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  He does have some numbness and wakes up in the morning as well.  Sometimes it seems to be position dependent on how he holds his wrist.  HPI  Review of Systems He currently denies any headache, chest pain, shortness of breath, fever, chills, nausea, vomiting.  Objective: Vital Signs: There were no vitals taken for this visit.  Physical Exam He is  alert and oriented x3 and in no acute distress Ortho Exam Examination of his right hand shows that is well-perfused.  He does have weak grip and pinch strength.  He has subjective numbness in the median nerve distribution.  Compression at the transverse carpal ligament does cause increased numbness in his right hand. Specialty Comments:  No specialty comments available.  Imaging: No results found.   PMFS History: Patient Active Problem List   Diagnosis Date Noted  . Carpal tunnel syndrome, right upper limb 10/16/2017   History reviewed. No pertinent past medical history.  History reviewed. No pertinent family history.  History reviewed. No pertinent surgical history. Social History   Occupational History  . Not on file  Tobacco Use  . Smoking status: Former Smoker    Last attempt to quit: 2013    Years since quitting: 6.4  . Smokeless tobacco: Never Used  Substance and Sexual Activity  . Alcohol use: Not on file  . Drug use: Not on file  . Sexual activity: Not on file

## 2017-11-19 ENCOUNTER — Ambulatory Visit (INDEPENDENT_AMBULATORY_CARE_PROVIDER_SITE_OTHER): Payer: Medicare HMO | Admitting: Physical Medicine and Rehabilitation

## 2017-11-19 ENCOUNTER — Encounter (INDEPENDENT_AMBULATORY_CARE_PROVIDER_SITE_OTHER): Payer: Self-pay | Admitting: Physical Medicine and Rehabilitation

## 2017-11-19 DIAGNOSIS — R202 Paresthesia of skin: Secondary | ICD-10-CM

## 2017-11-19 NOTE — Progress Notes (Signed)
Jamal CollinDennis Hornbeck - 72 y.o. male MRN [id number]  Date of birth: 1946/01/03  Office Visit Note: Visit Date: 11/19/2017 PCP: Cheron SchaumannVelazquez, Gretchen Y., MD Referred by: Cheron SchaumannVelazquez, Gretchen Y.,*  Subjective: Chief Complaint  Patient presents with  . Right Hand - Numbness   HPI: Mr. Delton SeeMoretz is a very pleasant right-hand-dominant 72 year old who is been having worsening right dominant hand numbness and tingling and pain he points to the thumb, index, and middle fingers as a source of numbness and tingling as well as pain and weakness.  This is been slowly getting worse with time.  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  He reports a positive flick sign which happened early on in the process but now it is constantly numb.  He has difficulty with buttoning clothes and difficulty putting a knot on a bolt.  He does not endorse any left-sided complaints.  He denies any radicular pain down the arm.  He also gets some worsening with driving and talking on the phone.  Again he has noticed weakness but he continues to do some work.   ROS Otherwise per HPI.  Assessment & Plan: Visit Diagnoses:  1. Paresthesia of skin     Plan: No additional findings.  Impression: The above electrodiagnostic study is ABNORMAL and reveals evidence of a severe right median nerve entrapment at the wrist (carpal tunnel syndrome) affecting sensory and motor components. The lesion is characterized by sensory and motor demyelination with evidence of significant axonal injury.  **Despite appropriate decompressive surgery there is likely going to be some residual nerve damage.  There is no significant electrodiagnostic evidence of any other focal nerve entrapment, brachial plexopathy or cervical radiculopathy.   Recommendations: 1.  Follow-up with referring physician. 2.  Continue current management of symptoms. 3.  Suggest surgical evaluation.   Meds & Orders: No orders of the defined types were placed in this encounter.   Orders Placed This  Encounter  Procedures  . NCV with EMG (electromyography)    Follow-up: [facility]   Procedures: No procedures performed  EMG & NCV Findings: Evaluation of the right median motor nerve showed prolonged distal onset latency (4.8 ms), reduced amplitude (0.1 mV), and decreased conduction velocity (Elbow-Wrist, 42 m/s).  The right median (across palm) sensory nerve showed no response (Wrist) and prolonged distal peak latency (Palm, 3.4 ms).  All remaining nerves (as indicated in the following tables) were within normal limits.    Needle evaluation of the right abductor pollicis brevis muscle showed decreased insertional activity, widespread spontaneous activity, and diminished recruitment.  All remaining muscles (as indicated in the following table) showed no evidence of electrical instability.    Impression: The above electrodiagnostic study is ABNORMAL and reveals evidence of a severe right median nerve entrapment at the wrist (carpal tunnel syndrome) affecting sensory and motor components. The lesion is characterized by sensory and motor demyelination with evidence of significant axonal injury.  **Despite appropriate decompressive surgery there is likely going to be some residual nerve damage.  There is no significant electrodiagnostic evidence of any other focal nerve entrapment, brachial plexopathy or cervical radiculopathy.   Recommendations: 1.  Follow-up with referring physician. 2.  Continue current management of symptoms. 3.  Suggest surgical evaluation.  ___________________________ Elease HashimotoFred Amry Cathy FAAPMR Board Certified, American Board of Physical Medicine and Rehabilitation    Nerve Conduction Studies Anti Sensory Summary Table   Stim Site NR Peak (ms) Norm Peak (ms) P-T Amp (V) Norm P-T Amp Site1 Site2 Delta-P (ms) Dist (  cm) Vel (m/s) Norm Vel (m/s)  Right Median Acr Palm Anti Sensory (2nd Digit)  31.8C  Wrist *NR  <3.6  >10 Wrist Palm  0.0    Palm     *3.4 <2.0 2.9         Right Radial Anti Sensory (Base 1st Digit)  34C  Wrist    3.1 <3.1 5.8  Wrist Base 1st Digit 3.1 0.0    Right Ulnar Anti Sensory (5th Digit)  32.7C  Wrist    3.6 <3.7 19.1 >15.0 Wrist 5th Digit 3.6 14.0 39 >38   Motor Summary Table   Stim Site NR Onset (ms) Norm Onset (ms) O-P Amp (mV) Norm O-P Amp Site1 Site2 Delta-0 (ms) Dist (cm) Vel (m/s) Norm Vel (m/s)  Right Median Motor (Abd Poll Brev)  31.9C  Wrist    *4.8 <4.2 *0.1 >5 Elbow Wrist 5.0 21.0 *42 >50  Elbow    9.8  0.0         Right Ulnar Motor (Abd Dig Min)  31.3C  Wrist    3.5 <4.2 8.5 >3 B Elbow Wrist 3.2 20.0 63 >53  B Elbow    6.7  7.6  A Elbow B Elbow 1.6 10.0 62 >53  A Elbow    8.3  7.3          EMG   Side Muscle Nerve Root Ins Act Fibs Psw Amp Dur Poly Recrt Int Dennie Bible Comment  Right Abd Poll Brev Median C8-T1 *Decr *4+ *4+ Nml Nml 0 *Reduced Nml no muap  Right 1stDorInt Ulnar C8-T1 Nml Nml Nml Nml Nml 0 Nml Nml   Right PronatorTeres Median C6-7 Nml Nml Nml Nml Nml 0 Nml Nml   Right Biceps Musculocut C5-6 Nml Nml Nml Nml Nml 0 Nml Nml   Right Deltoid Axillary C5-6 Nml Nml Nml Nml Nml 0 Nml Nml     Nerve Conduction Studies Anti Sensory Left/Right Comparison   Stim Site L Lat (ms) R Lat (ms) L-R Lat (ms) L Amp (V) R Amp (V) L-R Amp (%) Site1 Site2 L Vel (m/s) R Vel (m/s) L-R Vel (m/s)  Median Acr Palm Anti Sensory (2nd Digit)  31.8C  Wrist       Wrist Palm     Palm  *3.4   2.9        Radial Anti Sensory (Base 1st Digit)  34C  Wrist  3.1   5.8  Wrist Base 1st Digit     Ulnar Anti Sensory (5th Digit)  32.7C  Wrist  3.6   19.1  Wrist 5th Digit  39    Motor Left/Right Comparison   Stim Site L Lat (ms) R Lat (ms) L-R Lat (ms) L Amp (mV) R Amp (mV) L-R Amp (%) Site1 Site2 L Vel (m/s) R Vel (m/s) L-R Vel (m/s)  Median Motor (Abd Poll Brev)  31.9C  Wrist  *4.8   *0.1  Elbow Wrist  *42   Elbow  9.8   0.0        Ulnar Motor (Abd Dig Min)  31.3C  Wrist  3.5   8.5  B Elbow Wrist  63   B  Elbow  6.7   7.6  A Elbow B Elbow  62   A Elbow  8.3   7.3           Waveforms:            Clinical History: Lumbar spine MRI dated 11/21/2015  L4-5: Diffuse bulging degenerated annulus,  short pedicles, advanced facet disease and significant ligamentum flavum thickening all contributing to severe spinal and bilateral lateral recess stenosis. Mild foraminal encroachment bilaterally also. L5-S1: Mild annular bulge and moderate facet disease but no focal disc protrusions, spinal or foraminal stenosis. Fat IMPRESSION: 1. 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 2. Broad-based left foraminal and extra foraminal disc protrusion at L3-4 contacting and displacing the left L3 nerve root. This may account for the patient's left thigh pain. 3. Broad-based bulging annulus at L1-2 with mild bilateral lateral recess and foraminal encroachment.   He reports that he quit smoking about 6 years ago. He has never used smokeless tobacco. No results for input(s): HGBA1C, LABURIC in the last 8760 hours.  Objective:  VS:  HT:    WT:   BMI:     BP:   HR: bpm  TEMP: ( )  RESP:  Physical Exam  Musculoskeletal:  Inspection reveals significant atrophy of the APB on the no atrophy of the left  APB or bilateral FDI or hand intrinsics. There is no swelling, color changes, allodynia or dystrophic changes. There is 5 out of 5 strength in the bilateral wrist extension, finger abduction and long finger flexion.  There is decreased sensation to light touch in the median nerve distribution on the right.   There is a negative Phalen's test bilaterally. There is a negative Hoffmann's test bilaterally.    Ortho Exam Imaging: No results found.  Past Medical/Family/Surgical/Social History: Medications & Allergies reviewed per EMR, new medications updated. Patient Active Problem List   Diagnosis Date Noted  . Carpal tunnel syndrome, right upper limb 10/16/2017    History reviewed. No pertinent past medical history. History reviewed. No pertinent family history. History reviewed. No pertinent surgical history. Social History   Occupational History  . Not on file  Tobacco Use  . Smoking status: Former Smoker    Last attempt to quit: 2013    Years since quitting: 6.5  . Smokeless tobacco: Never Used  Substance and Sexual Activity  . Alcohol use: Not on file  . Drug use: Not on file  . Sexual activity: Not on file

## 2017-11-19 NOTE — Progress Notes (Signed)
 .  Numeric Pain Rating Scale and Functional Assessment Average Pain 0   In the last MONTH (on 0-10 scale) has pain interfered with the following?  1. General activity like being  able to carry out your everyday physical activities such as walking, climbing stairs, carrying groceries, or moving a chair?  Rating(9)    

## 2017-11-24 NOTE — Procedures (Signed)
EMG & NCV Findings: Evaluation of the right median motor nerve showed prolonged distal onset latency (4.8 ms), reduced amplitude (0.1 mV), and decreased conduction velocity (Elbow-Wrist, 42 m/s).  The right median (across palm) sensory nerve showed no response (Wrist) and prolonged distal peak latency (Palm, 3.4 ms).  All remaining nerves (as indicated in the following tables) were within normal limits.    Needle evaluation of the right abductor pollicis brevis muscle showed decreased insertional activity, widespread spontaneous activity, and diminished recruitment.  All remaining muscles (as indicated in the following table) showed no evidence of electrical instability.    Impression: The above electrodiagnostic study is ABNORMAL and reveals evidence of a severe right median nerve entrapment at the wrist (carpal tunnel syndrome) affecting sensory and motor components. The lesion is characterized by sensory and motor demyelination with evidence of significant axonal injury.  **Despite appropriate decompressive surgery there is likely going to be some residual nerve damage.  There is no significant electrodiagnostic evidence of any other focal nerve entrapment, brachial plexopathy or cervical radiculopathy.   Recommendations: 1.  Follow-up with referring physician. 2.  Continue current management of symptoms. 3.  Suggest surgical evaluation.  ___________________________ Naaman Plummer FAAPMR Board Certified, American Board of Physical Medicine and Rehabilitation    Nerve Conduction Studies Anti Sensory Summary Table   Stim Site NR Peak (ms) Norm Peak (ms) P-T Amp (V) Norm P-T Amp Site1 Site2 Delta-P (ms) Dist (cm) Vel (m/s) Norm Vel (m/s)  Right Median Acr Palm Anti Sensory (2nd Digit)  31.8C  Wrist *NR  <3.6  >10 Wrist Palm  0.0    Palm    *3.4 <2.0 2.9         Right Radial Anti Sensory (Base 1st Digit)  34C  Wrist    3.1 <3.1 5.8  Wrist Base 1st Digit 3.1 0.0    Right Ulnar Anti  Sensory (5th Digit)  32.7C  Wrist    3.6 <3.7 19.1 >15.0 Wrist 5th Digit 3.6 14.0 39 >38   Motor Summary Table   Stim Site NR Onset (ms) Norm Onset (ms) O-P Amp (mV) Norm O-P Amp Site1 Site2 Delta-0 (ms) Dist (cm) Vel (m/s) Norm Vel (m/s)  Right Median Motor (Abd Poll Brev)  31.9C  Wrist    *4.8 <4.2 *0.1 >5 Elbow Wrist 5.0 21.0 *42 >50  Elbow    9.8  0.0         Right Ulnar Motor (Abd Dig Min)  31.3C  Wrist    3.5 <4.2 8.5 >3 B Elbow Wrist 3.2 20.0 63 >53  B Elbow    6.7  7.6  A Elbow B Elbow 1.6 10.0 62 >53  A Elbow    8.3  7.3          EMG   Side Muscle Nerve Root Ins Act Fibs Psw Amp Dur Poly Recrt Int Dennie Bible Comment  Right Abd Poll Brev Median C8-T1 *Decr *4+ *4+ Nml Nml 0 *Reduced Nml no muap  Right 1stDorInt Ulnar C8-T1 Nml Nml Nml Nml Nml 0 Nml Nml   Right PronatorTeres Median C6-7 Nml Nml Nml Nml Nml 0 Nml Nml   Right Biceps Musculocut C5-6 Nml Nml Nml Nml Nml 0 Nml Nml   Right Deltoid Axillary C5-6 Nml Nml Nml Nml Nml 0 Nml Nml     Nerve Conduction Studies Anti Sensory Left/Right Comparison   Stim Site L Lat (ms) R Lat (ms) L-R Lat (ms) L Amp (V) R Amp (V) L-R Amp (%) Site1  Site2 L Vel (m/s) R Vel (m/s) L-R Vel (m/s)  Median Acr Palm Anti Sensory (2nd Digit)  31.8C  Wrist       Wrist Palm     Palm  *3.4   2.9        Radial Anti Sensory (Base 1st Digit)  34C  Wrist  3.1   5.8  Wrist Base 1st Digit     Ulnar Anti Sensory (5th Digit)  32.7C  Wrist  3.6   19.1  Wrist 5th Digit  39    Motor Left/Right Comparison   Stim Site L Lat (ms) R Lat (ms) L-R Lat (ms) L Amp (mV) R Amp (mV) L-R Amp (%) Site1 Site2 L Vel (m/s) R Vel (m/s) L-R Vel (m/s)  Median Motor (Abd Poll Brev)  31.9C  Wrist  *4.8   *0.1  Elbow Wrist  *42   Elbow  9.8   0.0        Ulnar Motor (Abd Dig Min)  31.3C  Wrist  3.5   8.5  B Elbow Wrist  63   B Elbow  6.7   7.6  A Elbow B Elbow  62   A Elbow  8.3   7.3           Waveforms:

## 2017-11-26 ENCOUNTER — Encounter (INDEPENDENT_AMBULATORY_CARE_PROVIDER_SITE_OTHER): Payer: Self-pay | Admitting: Orthopaedic Surgery

## 2017-11-26 ENCOUNTER — Ambulatory Visit (INDEPENDENT_AMBULATORY_CARE_PROVIDER_SITE_OTHER): Payer: Medicare HMO | Admitting: Orthopaedic Surgery

## 2017-11-26 DIAGNOSIS — M79641 Pain in right hand: Secondary | ICD-10-CM | POA: Diagnosis not present

## 2017-11-26 DIAGNOSIS — G5601 Carpal tunnel syndrome, right upper limb: Secondary | ICD-10-CM | POA: Diagnosis not present

## 2017-11-26 NOTE — Progress Notes (Signed)
The patient is following up after having right upper extremity nerve conduction studies due to weakness in his fingers and numbness and tingling as well.  He is 72 years old.  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  He is having trouble gripping objects shoe with his dexterity and coordination of the hand.  He also reports numbness and tingling.  Nerve conduction studies do confirm severe carpal tunnel syndrome with significant entrapment of the median nerve at the transverse carpal ligament.  Shared with the patient the studies.  I have recommended an open carpal tunnel release.  I also gave him our surgery schedulers card.  I explained in detail what the surgery involves.  We talked about the risk minutes of surgery as well and what his recovery may be given the severity of his disease.  All question concerns were answered and addressed.  If he decides to have this done he will give us a call and we can set this up as an outpatient.

## 2018-06-03 IMAGING — MR MR LUMBAR SPINE W/O CM
5 series · 40 of 48 positions shown · non-contrast
Comparison: None.

CLINICAL DATA: One year history of low back pain and left thigh
pain.

EXAM:
MRI LUMBAR SPINE WITHOUT CONTRAST
TECHNIQUE: Multiplanar, multisequence MR imaging of the lumbar spine was
performed. No intravenous contrast was administered.

[Series 3: T2 · sagittal · 4.0mm · 0.88mm/px · 7 of 15 slices shown (1 of 2)]
[im 1/15]
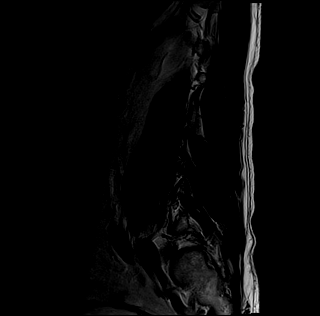
[im 3/15]
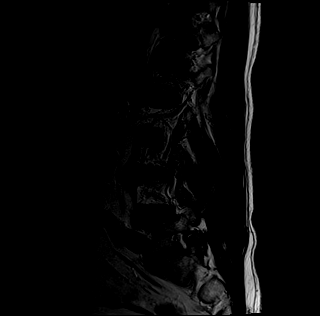
[im 5/15]
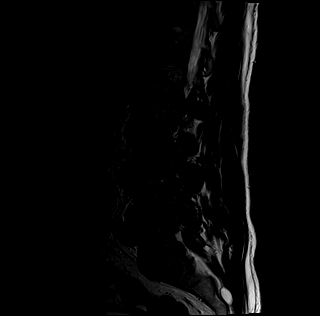
[im 8/15]
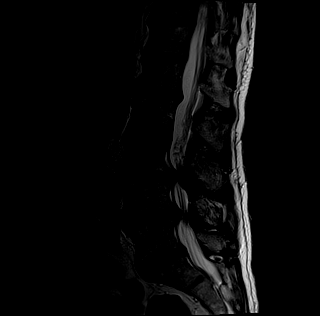
[im 10/15]
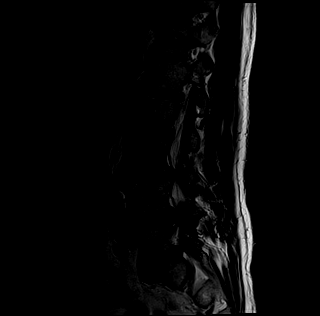
[im 12/15]
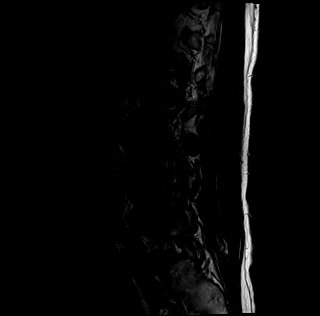
[im 15/15]
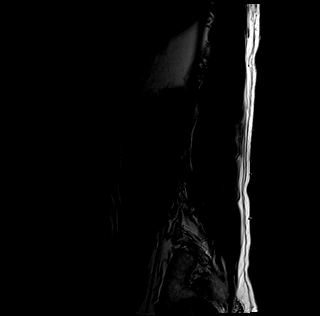

[Series 4: tirm sag · sagittal · 4.0mm · 0.55mm/px · 7 of 15 slices shown]
[im 1/15]
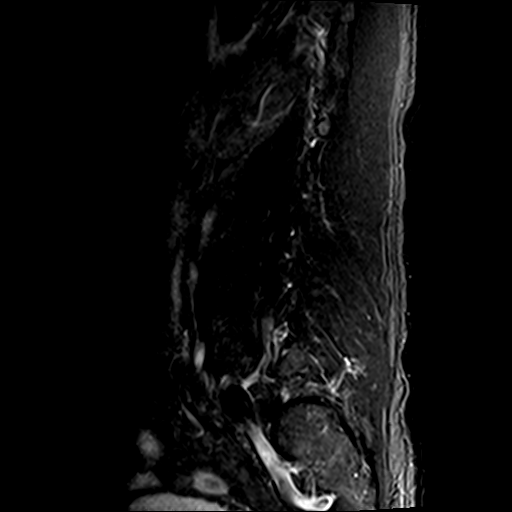
[im 3/15]
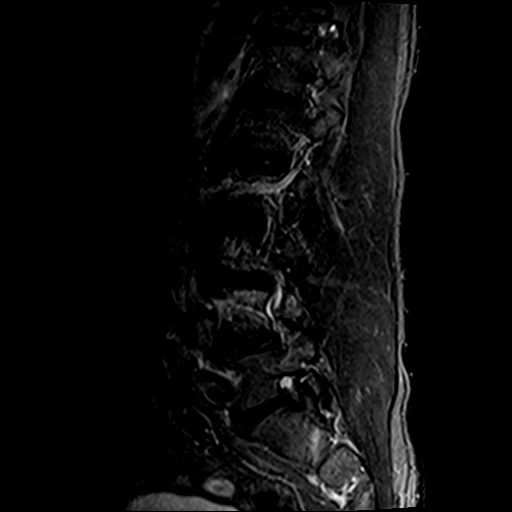
[im 5/15]
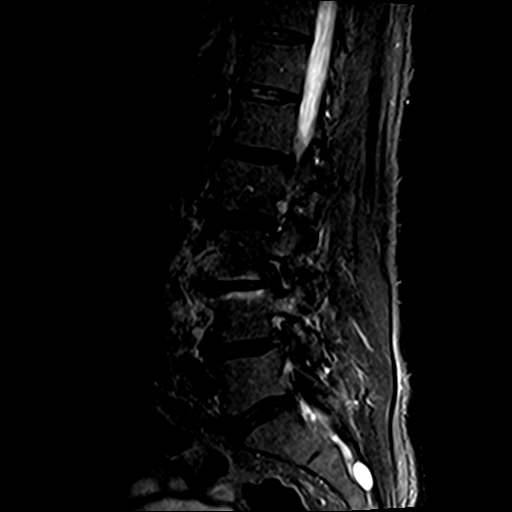
[im 8/15]
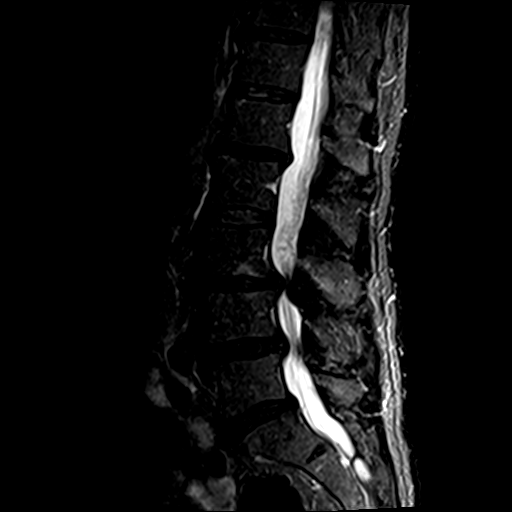
[im 10/15]
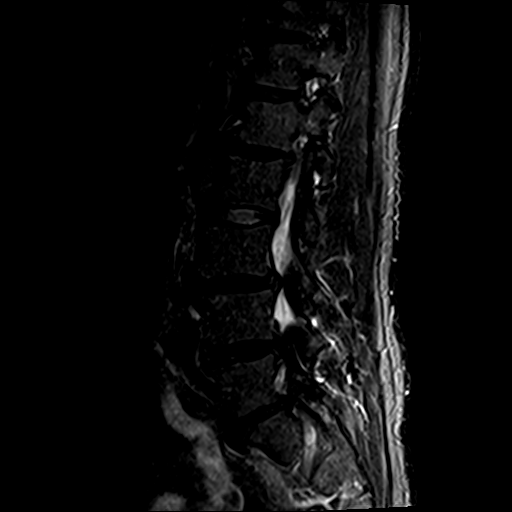
[im 12/15]
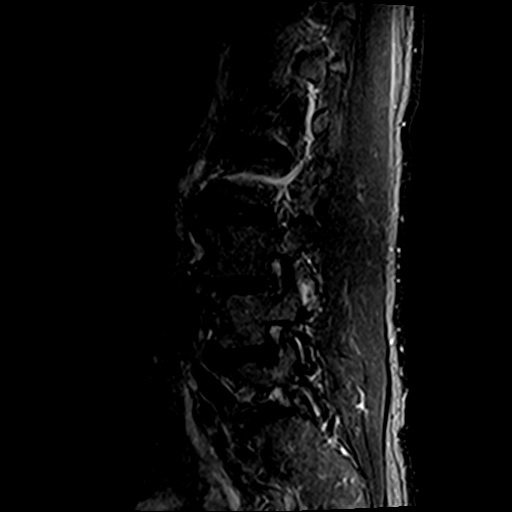
[im 15/15]
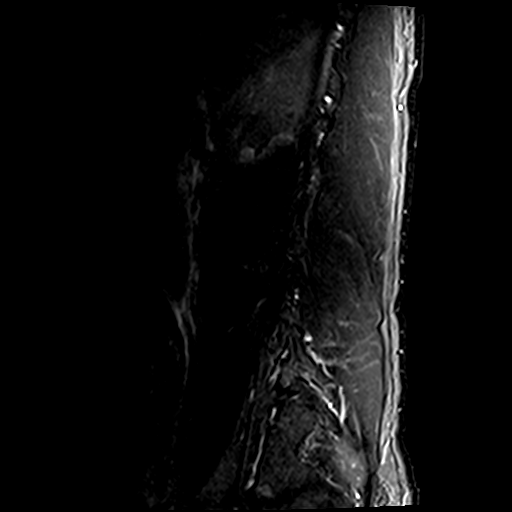

[Series 5: T1 · sagittal · 4.0mm · 0.88mm/px · 6 of 15 slices shown (1 of 2)]
[im 1/15]
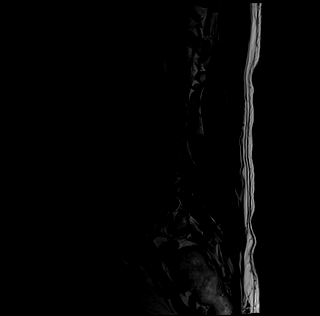
[im 3/15]
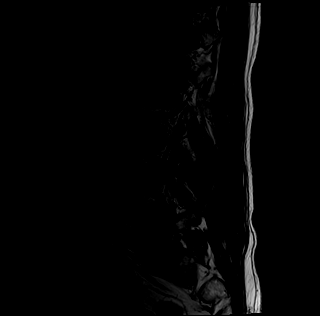
[im 6/15]
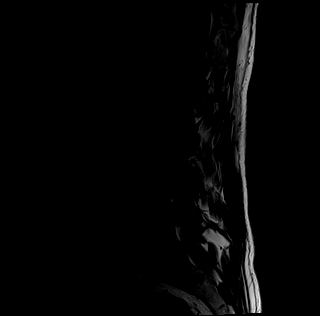
[im 9/15]
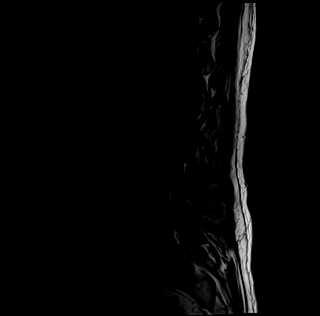
[im 12/15]
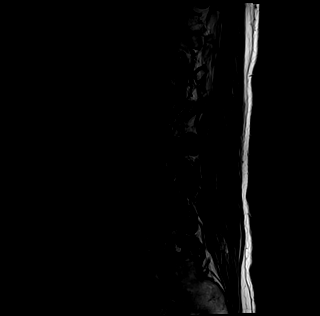
[im 15/15]
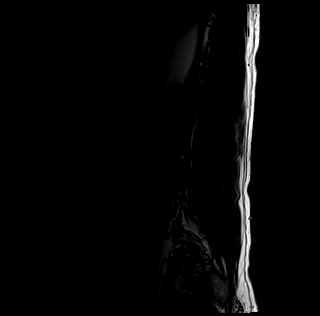

[Series 6: T1 · axial · 4.0mm · 0.70mm/px · z∈[-60,+124]mm · 8 of 33 slices shown (2 of 2)]
[im 1/33]
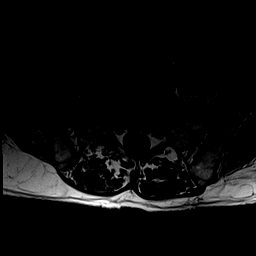
[im 5/33]
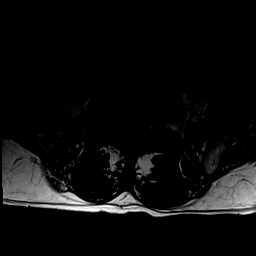
[im 10/33]
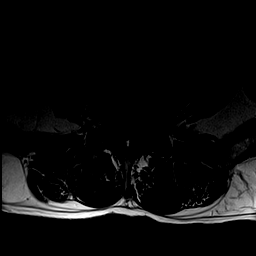
[im 15/33]
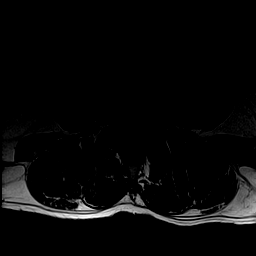
[im 18/33]
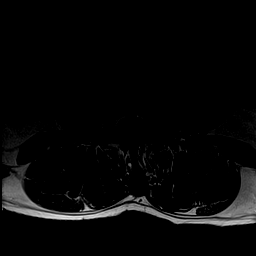
[im 23/33]
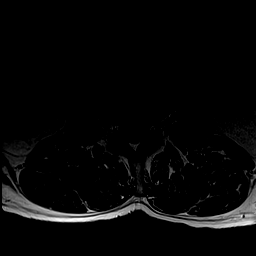
[im 28/33]
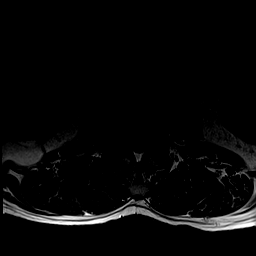
[im 33/33]
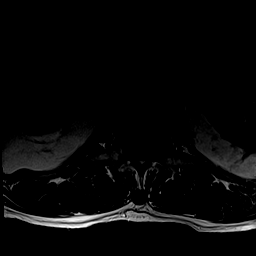

[Series 7: T2 · axial · 4.0mm · 0.70mm/px · z∈[-60,+124]mm · 12 of 33 slices shown (2 of 2)]
[im 1/33]
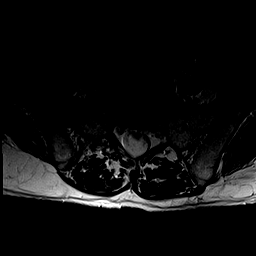
[im 3/33]
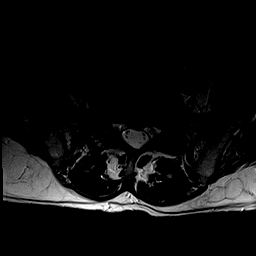
[im 5/33]
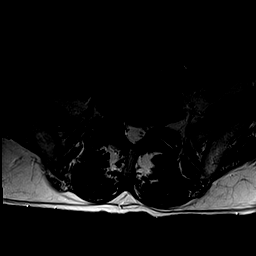
[im 8/33]
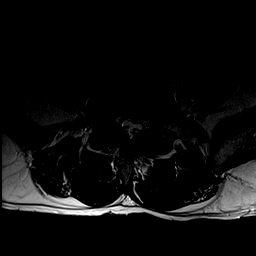
[im 10/33]
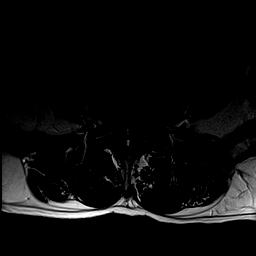
[im 13/33]
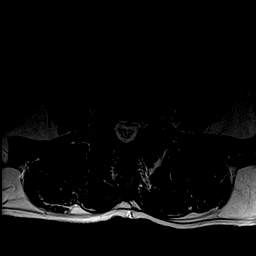
[im 15/33]
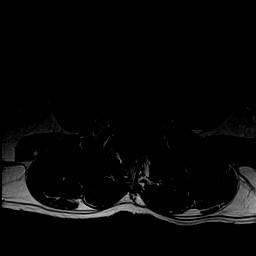
[im 18/33]
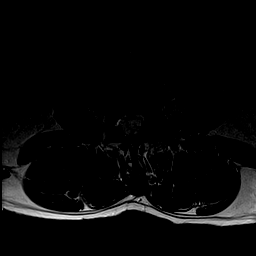
[im 20/33]
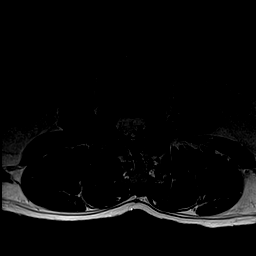
[im 23/33]
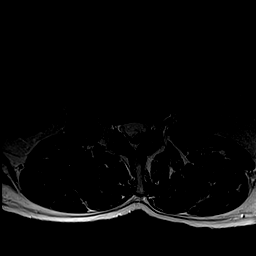
[im 28/33]
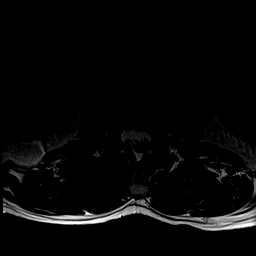
[im 33/33]
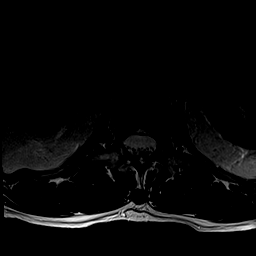

[40 of 48 positions shown; findings below may reference images not displayed]

FINDINGS: Segmentation: 5 lumbar type vertebral bodies. The last full
intervertebral disc space is labeled L5-S1.

Alignment: Degenerative anterolisthesis of L3. The other vertebral
bodies are normally aligned.

Vertebrae:  Normal

Conus medullaris: Extends to the L1 level and appears normal.

Paraspinal and other soft tissues: Unremarkable. There is moderate
tortuosity of the abdominal aorta.

Disc levels:

L1-2: Broad-based bulging annulus with mild bilateral lateral recess
encroachment and mild bilateral foraminal encroachment. The spinal
canal is generous in is no significant spinal stenosis.

L2-3: Mild diffuse annular bulge with minimal right lateral recess
encroachment. No spinal or foraminal stenosis. Mild facet disease.

L3-4: Advanced facet disease with degenerative anterolisthesis of
L3. There is a diffuse bulging degenerated annulus, short pedicles
and ligamentum flavum thickening all contributing to severe spinal
and bilateral lateral recess stenosis. There is also a broad-based
left foraminal and extra foraminal disc protrusion contacting and
displacing the left L3 nerve root which may account for the
patient's left thigh pain.

L4-5: Diffuse bulging degenerated annulus, short pedicles, advanced
facet disease and significant ligamentum flavum thickening all
contributing to severe spinal and bilateral lateral recess stenosis.
Mild foraminal encroachment bilaterally also.

L5-S1: Mild annular bulge and moderate facet disease but no focal
disc protrusions, spinal or foraminal stenosis. Fat
IMPRESSION: 1. Severe multifactorial spinal and bilateral lateral recess
stenosis at L3-4 and L4-5.
2. Broad-based left foraminal and extra foraminal disc protrusion at
L3-4 contacting and displacing the left L3 nerve root. This may
account for the patient's left thigh pain.
3. Broad-based bulging annulus at L1-2 with mild bilateral lateral
recess and foraminal encroachment.

## 2020-04-29 DEATH — deceased
